# Patient Record
Sex: Male | Born: 1955 | Race: White | Hispanic: No | Marital: Married | Smoking: Current every day smoker
Health system: Southern US, Community
[De-identification: ages and names within clinical notes are randomized; demographics above are authoritative.]

## PROBLEM LIST (undated history)

## (undated) HISTORY — PX: HERNIA REPAIR: SHX51

---

## 2015-07-24 ENCOUNTER — Emergency Department (HOSPITAL_BASED_OUTPATIENT_CLINIC_OR_DEPARTMENT_OTHER)
Admission: EM | Admit: 2015-07-24 | Discharge: 2015-07-24 | Disposition: A | Discharge status: Home / Self Care | Payer: BLUE CROSS/BLUE SHIELD | Attending: Emergency Medicine | Admitting: Emergency Medicine

## 2015-07-24 ENCOUNTER — Encounter (HOSPITAL_BASED_OUTPATIENT_CLINIC_OR_DEPARTMENT_OTHER): Payer: Self-pay

## 2015-07-24 DIAGNOSIS — F17200 Nicotine dependence, unspecified, uncomplicated: Secondary | ICD-10-CM | POA: Diagnosis not present

## 2015-07-24 DIAGNOSIS — H8110 Benign paroxysmal vertigo, unspecified ear: Secondary | ICD-10-CM | POA: Insufficient documentation

## 2015-07-24 DIAGNOSIS — Z880 Allergy status to penicillin: Secondary | ICD-10-CM | POA: Diagnosis not present

## 2015-07-24 DIAGNOSIS — R42 Dizziness and giddiness: Secondary | ICD-10-CM | POA: Diagnosis present

## 2015-07-24 LAB — CBC WITH DIFFERENTIAL/PLATELET
BASOS ABS: 0 10*3/uL (ref 0.0–0.1)
BASOS PCT: 0 %
EOS PCT: 0 %
Eosinophils Absolute: 0 10*3/uL (ref 0.0–0.7)
HCT: 49.4 % (ref 39.0–52.0)
Hemoglobin: 16.4 g/dL (ref 13.0–17.0)
Lymphocytes Relative: 12 %
Lymphs Abs: 1.3 10*3/uL (ref 0.7–4.0)
MCH: 29.1 pg (ref 26.0–34.0)
MCHC: 33.2 g/dL (ref 30.0–36.0)
MCV: 87.6 fL (ref 78.0–100.0)
MONO ABS: 0.8 10*3/uL (ref 0.1–1.0)
Monocytes Relative: 7 %
Neutro Abs: 8.7 10*3/uL — ABNORMAL HIGH (ref 1.7–7.7)
Neutrophils Relative %: 81 %
PLATELETS: 210 10*3/uL (ref 150–400)
RBC: 5.64 MIL/uL (ref 4.22–5.81)
RDW: 13.3 % (ref 11.5–15.5)
WBC: 10.9 10*3/uL — ABNORMAL HIGH (ref 4.0–10.5)

## 2015-07-24 LAB — BASIC METABOLIC PANEL
ANION GAP: 7 (ref 5–15)
BUN: 13 mg/dL (ref 6–20)
CALCIUM: 9.3 mg/dL (ref 8.9–10.3)
CO2: 26 mmol/L (ref 22–32)
Chloride: 105 mmol/L (ref 101–111)
Creatinine, Ser: 0.96 mg/dL (ref 0.61–1.24)
Glucose, Bld: 127 mg/dL — ABNORMAL HIGH (ref 65–99)
Potassium: 4.3 mmol/L (ref 3.5–5.1)
Sodium: 138 mmol/L (ref 135–145)

## 2015-07-24 MED ORDER — MECLIZINE HCL 25 MG PO TABS
25.0000 mg | ORAL_TABLET | Freq: Once | ORAL | Status: AC
  Administered 2015-07-24: 25 mg via ORAL
  Filled 2015-07-24: qty 1

## 2015-07-24 MED ORDER — MECLIZINE HCL 25 MG PO TABS: 25.0000 mg | ORAL_TABLET | Freq: Three times a day (TID) | ORAL | Status: AC | PRN

## 2015-07-24 MED ORDER — DIAZEPAM 2 MG PO TABS
2.0000 mg | ORAL_TABLET | Freq: Once | ORAL | Status: DC
  Filled 2015-07-24: qty 1

## 2015-07-24 MED ORDER — DIAZEPAM 5 MG/ML IJ SOLN
2.5000 mg | Freq: Once | INTRAMUSCULAR | Status: AC
  Administered 2015-07-24: 2.5 mg via INTRAVENOUS
  Filled 2015-07-24: qty 2

## 2015-07-24 MED FILL — MECLIZINE 25 MG TABLET: 25 | 10 days supply | Qty: 30 | Fill #0

## 2015-07-24 NOTE — ED Notes (Signed)
Patient stable and ambulatory.  Patient verbalizes understanding of discharge medications, instructions and follow-up. 

## 2015-07-24 NOTE — ED Notes (Signed)
Tinnitus in right ear.

## 2015-07-24 NOTE — ED Provider Notes (Signed)
CSN: [id number]     Arrival date & time 07/24/15  1152 History   First MD Initiated Contact with Patient 07/24/15 1208     Chief Complaint  Patient presents with  . Dizziness     (Consider location/radiation/quality/duration/timing/severity/associated sxs/prior Treatment) Patient is a 60 y.o. male presenting with dizziness.  Dizziness Quality:  Lightheadedness Severity:  Moderate Onset quality:  Gradual Duration:  1 day Timing:  Constant Progression:  Worsening Chronicity:  New Context: head movement   Context: not with loss of consciousness   Relieved by:  Nothing Worsened by:  Nothing Ineffective treatments:  None tried Associated symptoms: nausea   Associated symptoms: no vomiting     History reviewed. No pertinent past medical history. Past Surgical History  Procedure Laterality Date  . Hernia repair     No family history on file. Social History  Substance Use Topics  . Smoking status: Current Every Day Smoker  . Smokeless tobacco: None  . Alcohol Use: Yes     Comment: rarely    Review of Systems  Gastrointestinal: Positive for nausea. Negative for vomiting.  Neurological: Positive for dizziness.  All other systems reviewed and are negative.     Allergies  Penicillins  Home Medications   Prior to Admission medications   Not on File   BP 142/77 mmHg  Pulse 58  Temp(Src) 97.5 F (36.4 C) (Oral)  Ht 6\' 3"  (1.905 m)  Wt 200 lb (90.719 kg)  BMI 25.00 kg/m2  SpO2 100% Physical Exam  Constitutional: He is oriented to person, place, and time. He appears well-developed and well-nourished. No distress.  HENT:  Head: Normocephalic and atraumatic.  Eyes: Conjunctivae are normal.  Neck: Neck supple. No tracheal deviation present.  Cardiovascular: Normal rate and regular rhythm.   Pulmonary/Chest: Effort normal. No respiratory distress.  Abdominal: Soft. He exhibits no distension.  Neurological: He is alert and oriented to person, place, and time. He  has normal strength. No cranial nerve deficit or sensory deficit. Coordination and gait normal. GCS eye subscore is 4. GCS verbal subscore is 5. GCS motor subscore is 6.  Normal finger to nose testing and rapid alternating movement. Normal head impulse testing, normal heel-to-shin testing.  Skin: Skin is warm and dry.  Psychiatric: He has a normal mood and affect.    ED Course  Procedures (including critical care time) Labs Review Labs Reviewed - No data to display  Imaging Review No results found. I have personally reviewed and evaluated these images and lab results as part of my medical decision-making.   EKG Interpretation None      MDM   Final diagnoses:  BPPV (benign paroxysmal positional vertigo), unspecified laterality    60 y.o. male presents with vertigo symptoms that started today at work. HINTS exam negative, symptoms are highly positional especially with tilting head back. No indication for imaging currently. Valium and meclizine improved symptoms greatly and Pt is ambulatory on discharge. Patient needs to establish primary care in the area and was provided contact information to do so.     Lyndal Pulleyaniel Maycie Luera, MD 07/24/15 [phone]

## 2015-07-24 NOTE — ED Notes (Addendum)
Dizziness, nausea, weakness since 0930am-steady gait-A/O-denies HA or CP

## 2015-07-24 NOTE — ED Notes (Signed)
Pt. Placed on auto vitals Q30.

## 2015-07-24 NOTE — Discharge Instructions (Signed)
Benign Positional Vertigo °Vertigo is the feeling that you or your surroundings are moving when they are not. Benign positional vertigo is the most common form of vertigo. The cause of this condition is not serious (is benign). This condition is triggered by certain movements and positions (is positional). This condition can be dangerous if it occurs while you are doing something that could endanger you or others, such as driving.  °CAUSES °In many cases, the cause of this condition is not known. It may be caused by a disturbance in an area of the inner ear that helps your brain to sense movement and balance. This disturbance can be caused by a viral infection (labyrinthitis), head injury, or repetitive motion. °RISK FACTORS °This condition is more likely to develop in: °· Women. °· People who are 50 years of age or older. °SYMPTOMS °Symptoms of this condition usually happen when you move your head or your eyes in different directions. Symptoms may start suddenly, and they usually last for less than a minute. Symptoms may include: °· Loss of balance and falling. °· Feeling like you are spinning or moving. °· Feeling like your surroundings are spinning or moving. °· Nausea and vomiting. °· Blurred vision. °· Dizziness. °· Involuntary eye movement (nystagmus). °Symptoms can be mild and cause only slight annoyance, or they can be severe and interfere with daily life. Episodes of benign positional vertigo may return (recur) over time, and they may be triggered by certain movements. Symptoms may improve over time. °DIAGNOSIS °This condition is usually diagnosed by medical history and a physical exam of the head, neck, and ears. You may be referred to a health care provider who specializes in ear, nose, and throat (ENT) problems (otolaryngologist) or a provider who specializes in disorders of the nervous system (neurologist). You may have additional testing, including: °· MRI. °· A CT scan. °· Eye movement tests. Your  health care provider may ask you to change positions quickly while he or she watches you for symptoms of benign positional vertigo, such as nystagmus. Eye movement may be tested with an electronystagmogram (ENG), caloric stimulation, the Dix-Hallpike test, or the roll test. °· An electroencephalogram (EEG). This records electrical activity in your brain. °· Hearing tests. °TREATMENT °Usually, your health care provider will treat this by moving your head in specific positions to adjust your inner ear back to normal. Surgery may be needed in severe cases, but this is rare. In some cases, benign positional vertigo may resolve on its own in 2-4 weeks. °HOME CARE INSTRUCTIONS °Safety °· Move slowly. Avoid sudden body or head movements. °· Avoid driving. °· Avoid operating heavy machinery. °· Avoid doing any tasks that would be dangerous to you or others if a vertigo episode would occur. °· If you have trouble walking or keeping your balance, try using a cane for stability. If you feel dizzy or unstable, sit down right away. °· Return to your normal activities as told by your health care provider. Ask your health care provider what activities are safe for you. °General Instructions °· Take over-the-counter and prescription medicines only as told by your health care provider. °· Avoid certain positions or movements as told by your health care provider. °· Drink enough fluid to keep your urine clear or pale yellow. °· Keep all follow-up visits as told by your health care provider. This is important. °SEEK MEDICAL CARE IF: °· You have a fever. °· Your condition gets worse or you develop new symptoms. °· Your family or friends   notice any behavioral changes. °· Your nausea or vomiting gets worse. °· You have numbness or a "pins and needles" sensation. °SEEK IMMEDIATE MEDICAL CARE IF: °· You have difficulty speaking or moving. °· You are always dizzy. °· You faint. °· You develop severe headaches. °· You have weakness in your  legs or arms. °· You have changes in your hearing or vision. °· You develop a stiff neck. °· You develop sensitivity to light. °  °This information is not intended to replace advice given to you by your health care provider. Make sure you discuss any questions you have with your health care provider. °  °Document Released: 04/12/2006 Document Revised: 03/26/2015 Document Reviewed: 10/28/2014 °Elsevier Interactive Patient Education ©2016 [employer] ° °Dizziness °Dizziness is a common problem. It is a feeling of unsteadiness or light-headedness. You may feel like you are about to faint. Dizziness can lead to injury if you stumble or fall. Anyone can become dizzy, but dizziness is more common in older adults. This condition can be caused by a number of things, including medicines, dehydration, or illness. °HOME CARE INSTRUCTIONS °Taking these steps may help with your condition: °Eating and Drinking °· Drink enough fluid to keep your urine clear or pale yellow. This helps to keep you from becoming dehydrated. Try to drink more clear fluids, such as water. °· Do not drink alcohol. °· Limit your caffeine intake if directed by your health care provider. °· Limit your salt intake if directed by your health care provider. °Activity °· Avoid making quick movements. °¨ Rise slowly from chairs and steady yourself until you feel okay. °¨ In the morning, first sit up on the side of the bed. When you feel okay, stand slowly while you hold onto something until you know that your balance is fine. °· Move your legs often if you need to stand in one place for a long time. Tighten and relax your muscles in your legs while you are standing. °· Do not drive or operate heavy machinery if you feel dizzy. °· Avoid bending down if you feel dizzy. Place items in your home so that they are easy for you to reach without leaning over. °Lifestyle °· Do not use any tobacco products, including cigarettes, chewing tobacco, or electronic  cigarettes. If you need help quitting, ask your health care provider. °· Try to reduce your stress level, such as with yoga or meditation. Talk with your health care provider if you need help. °General Instructions °· Watch your dizziness for any changes. °· Take medicines only as directed by your health care provider. Talk with your health care provider if you think that your dizziness is caused by a medicine that you are taking. °· Tell a friend or a family member that you are feeling dizzy. If he or she notices any changes in your behavior, have this person call your health care provider. °· Keep all follow-up visits as directed by your health care provider. This is important. °SEEK MEDICAL CARE IF: °· Your dizziness does not go away. °· Your dizziness or light-headedness gets worse. °· You feel nauseous. °· You have reduced hearing. °· You have new symptoms. °· You are unsteady on your feet or you feel like the room is spinning. °SEEK IMMEDIATE MEDICAL CARE IF: °· You vomit or have diarrhea and are unable to eat or drink anything. °· You have problems talking, walking, swallowing, or using your arms, hands, or legs. °· You feel generally weak. °· You are not   thinking clearly or you have trouble forming sentences. It may take a friend or family member to notice this. °· You have chest pain, abdominal pain, shortness of breath, or sweating. °· Your vision changes. °· You notice any bleeding. °· You have a headache. °· You have neck pain or a stiff neck. °· You have a fever. °  °This information is not intended to replace advice given to you by your health care provider. Make sure you discuss any questions you have with your health care provider. °  °Document Released: 12/29/2000 Document Revised: 11/19/2014 Document Reviewed: 07/01/2014 °Elsevier Interactive Patient Education ©2016 [employer] ° °

## 2017-10-12 ENCOUNTER — Other Ambulatory Visit: Payer: Self-pay | Admitting: *Deleted

## 2017-10-12 ENCOUNTER — Ambulatory Visit
Admission: RE | Admit: 2017-10-12 | Discharge: 2017-10-12 | Disposition: A | Discharge status: Home / Self Care | Payer: BLUE CROSS/BLUE SHIELD | Source: Ambulatory Visit | Attending: *Deleted | Admitting: *Deleted

## 2017-10-12 DIAGNOSIS — R591 Generalized enlarged lymph nodes: Secondary | ICD-10-CM

## 2017-10-14 ENCOUNTER — Other Ambulatory Visit: Payer: Self-pay | Admitting: Family Medicine

## 2017-10-14 DIAGNOSIS — R9389 Abnormal findings on diagnostic imaging of other specified body structures: Secondary | ICD-10-CM

## 2017-10-14 DIAGNOSIS — R221 Localized swelling, mass and lump, neck: Secondary | ICD-10-CM

## 2017-10-14 DIAGNOSIS — R591 Generalized enlarged lymph nodes: Secondary | ICD-10-CM

## 2017-10-19 ENCOUNTER — Ambulatory Visit
Admission: RE | Admit: 2017-10-19 | Discharge: 2017-10-19 | Disposition: A | Discharge status: Home / Self Care | Payer: BLUE CROSS/BLUE SHIELD | Source: Ambulatory Visit | Attending: Family Medicine | Admitting: Family Medicine

## 2017-10-19 DIAGNOSIS — R591 Generalized enlarged lymph nodes: Secondary | ICD-10-CM

## 2017-10-19 DIAGNOSIS — R9389 Abnormal findings on diagnostic imaging of other specified body structures: Secondary | ICD-10-CM

## 2017-10-19 DIAGNOSIS — R221 Localized swelling, mass and lump, neck: Secondary | ICD-10-CM

## 2017-10-19 MED ORDER — IOPAMIDOL (ISOVUE-300) INJECTION 61%
75.0000 mL | Freq: Once | INTRAVENOUS | Status: AC | PRN
  Administered 2017-10-19: 75 mL via INTRAVENOUS

## 2017-10-24 ENCOUNTER — Other Ambulatory Visit: Payer: BLUE CROSS/BLUE SHIELD

## 2019-08-20 ENCOUNTER — Ambulatory Visit: Payer: BLUE CROSS/BLUE SHIELD

## 2019-08-26 ENCOUNTER — Ambulatory Visit: Payer: Medicare Other | Attending: Internal Medicine

## 2019-08-26 DIAGNOSIS — Z23 Encounter for immunization: Secondary | ICD-10-CM

## 2019-08-26 NOTE — Progress Notes (Signed)
   Covid-19 Vaccination Clinic  Name:  Andres Scott    MRN: [id number] DOB: [birth year]  08/26/2019  [name] was observed post Covid-19 immunization for 15 minutes without incidence. He was provided with Vaccine Information Sheet and instruction to access the V-Safe system.   [name] was instructed to call 911 with any severe reactions post vaccine: Marland Kitchen Difficulty breathing  . Swelling of your face and throat  . A fast heartbeat  . A bad rash all over your body  . Dizziness and weakness    Immunizations Administered    Name Date Dose VIS Date Route   Pfizer COVID-19 Vaccine 08/26/2019 11:28 AM 0.3 mL 06/29/2019 Intramuscular   Manufacturer: [employer], [employer]   Lot: [id number]   NDC: [address]-4

## 2019-09-19 ENCOUNTER — Ambulatory Visit: Payer: Medicare Other | Attending: Internal Medicine

## 2019-09-19 DIAGNOSIS — Z23 Encounter for immunization: Secondary | ICD-10-CM | POA: Insufficient documentation

## 2019-09-19 NOTE — Progress Notes (Signed)
   Covid-19 Vaccination Clinic  Name:  Andres Scott    MRN: [id number] DOB: [birth year]  09/19/2019  [name] was observed post Covid-19 immunization for 15 minutes without incident. He was provided with Vaccine Information Sheet and instruction to access the V-Safe system.   [name] was instructed to call 911 with any severe reactions post vaccine: Marland Kitchen Difficulty breathing  . Swelling of face and throat  . A fast heartbeat  . A bad rash all over body  . Dizziness and weakness   Immunizations Administered    Name Date Dose VIS Date Route   Pfizer COVID-19 Vaccine 09/19/2019  8:41 AM 0.3 mL 06/29/2019 Intramuscular   Manufacturer: [employer], [employer]   Lot: [id number]   NDC: [address]-4

## 2019-11-02 IMAGING — CT CT NECK W/ CM
4 of 6 series · 12 of 33 positions shown, 14 images · IV contrast (iopamidol)
Comparison: Neck ultrasound 10/12/2017

CLINICAL DATA: Mass left lower neck.

Creatinine was obtained on site at [HOSPITAL] at [REDACTED].
Results: Creatinine 1.1 mg/dL.
EXAM:
CT NECK WITH CONTRAST
TECHNIQUE: Multidetector CT imaging of the neck was performed using the
standard protocol following the bolus administration of intravenous
contrast.
CONTRAST:  75mL 55PZJH-NAA IOPAMIDOL (55PZJH-NAA) INJECTION 61%

[Series 5: neck 2.00 br60 s3 bone · axial · 0.36mm/px · z∈[-753,-649]mm · 2 of 158 slices shown, 3 images]
[im 53/158  soft-tissue]
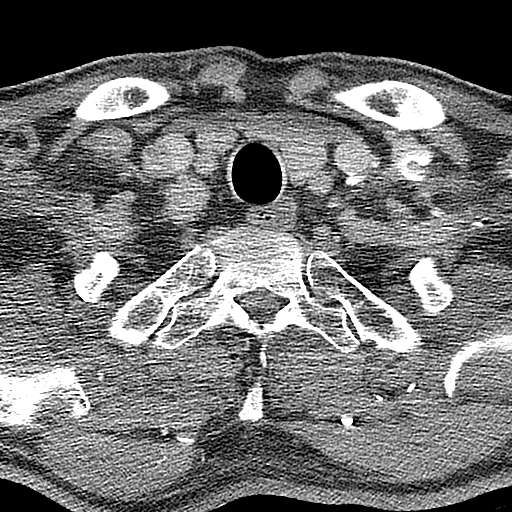
[im 53/158  bone]
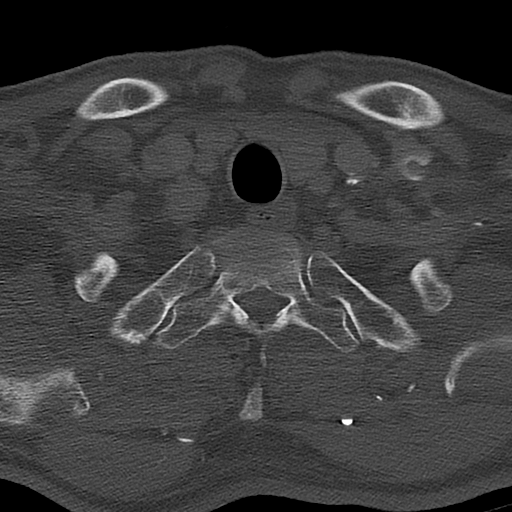
[im 105/158  bone]
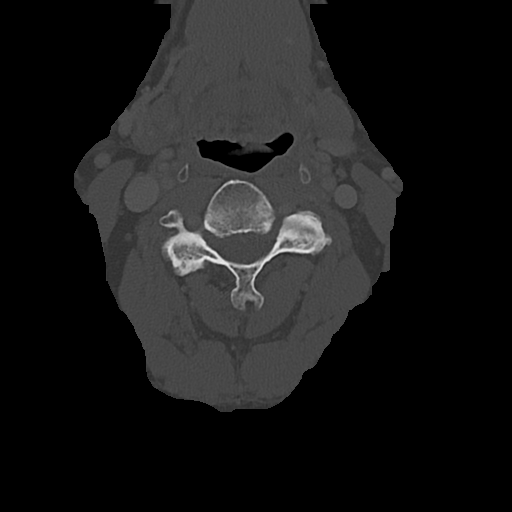

[Series 6: neck 2.00 br40 s3 ax oropharynx (person_name) · axial · 0.40mm/px · z∈[-782,-675]mm · 2 of 162 slices shown]
[im 54/162  bone]
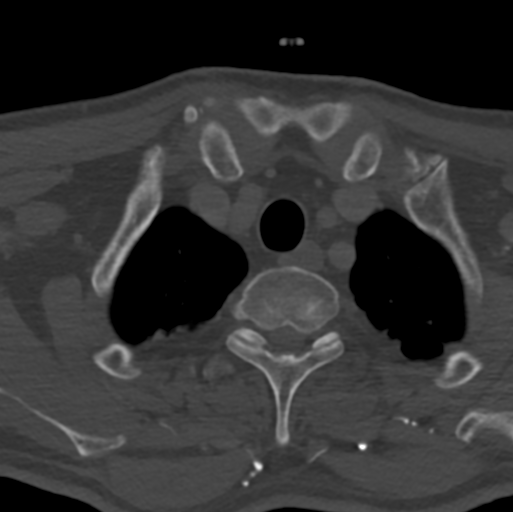
[im 108/162  bone]
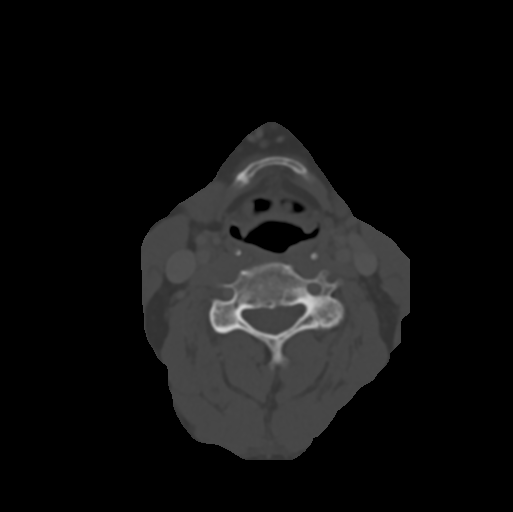

[Series 8: neck 2.00 br36 s3 cor coronal (person_name) · coronal · 0.63mm/px · 3 of 113 slices shown]
[im 23/113  bone]
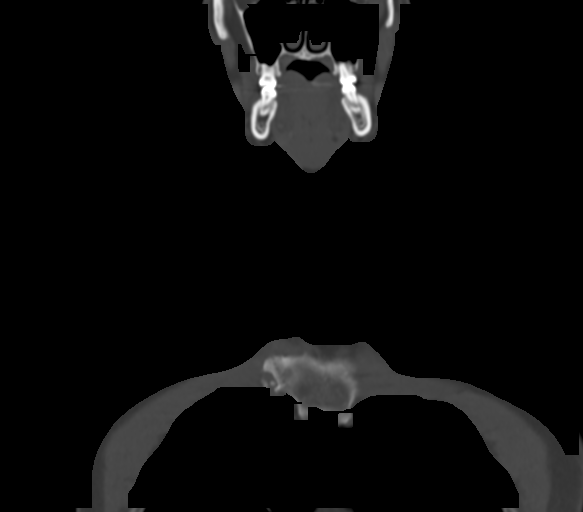
[im 45/113  bone]
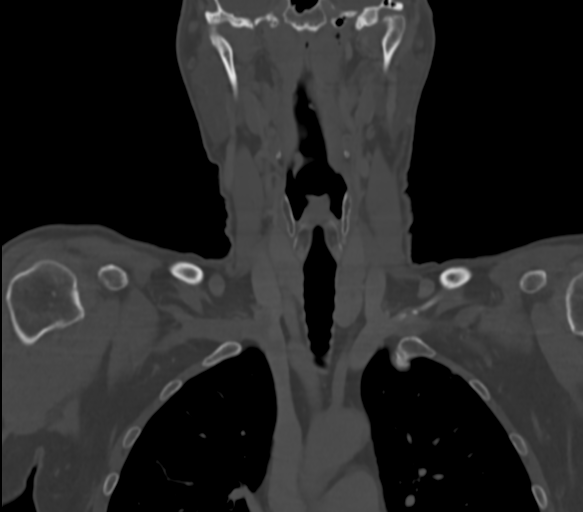
[im 68/113  bone]
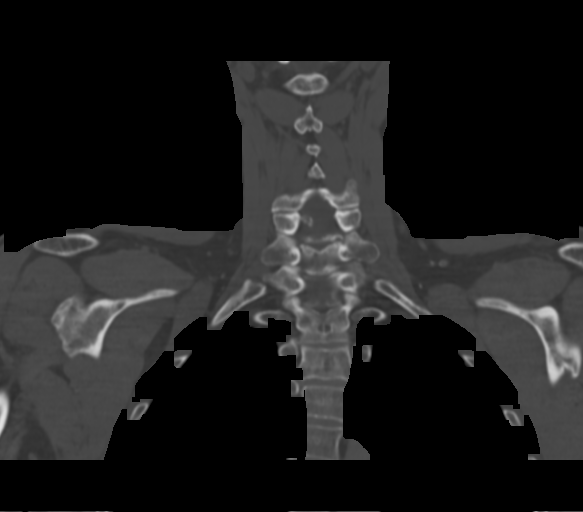

[Series 10: neck 2.00 br36 s3 sag sagittal (person_name) · sagittal · 0.40mm/px · 5 of 158 slices shown, 6 images]
[im 53/158  bone]
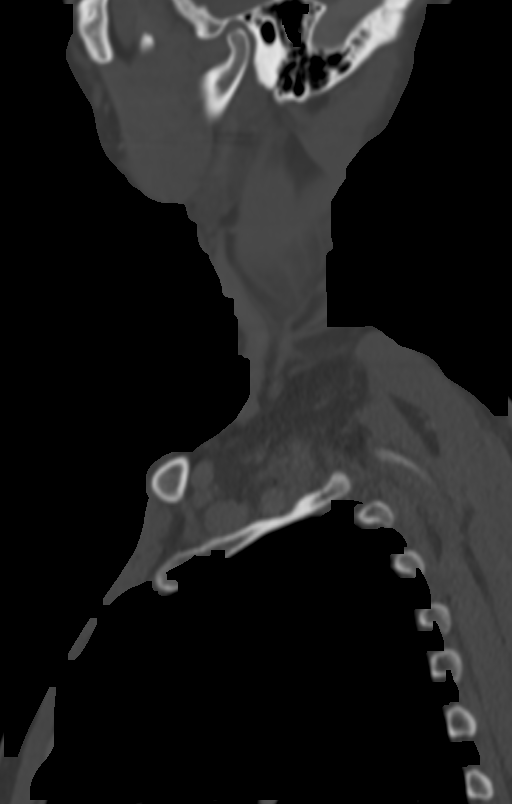
[im 66/158  bone]
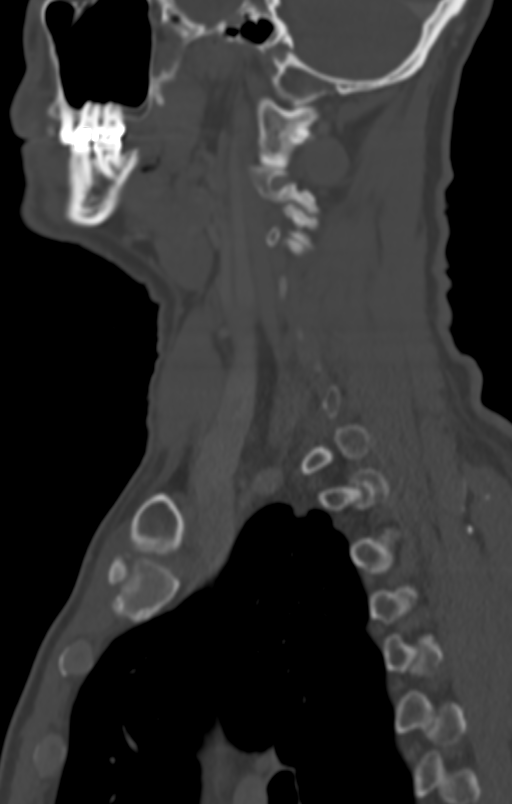
[im 79/158  soft-tissue]
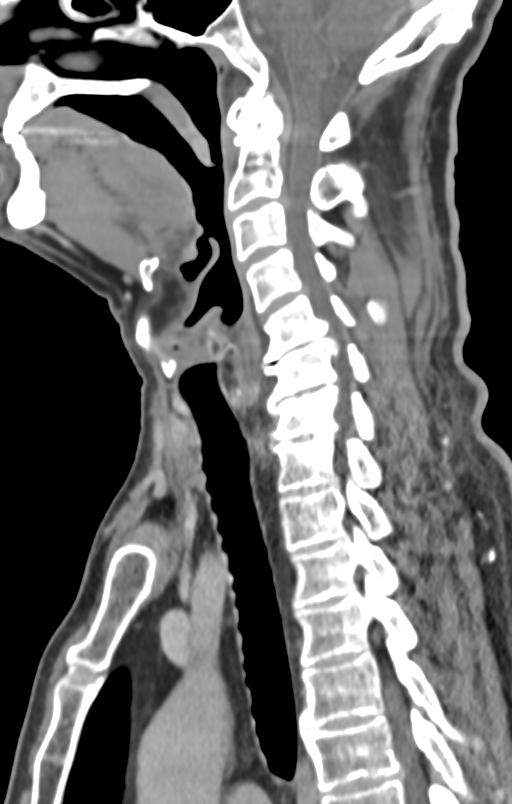
[im 79/158  bone]
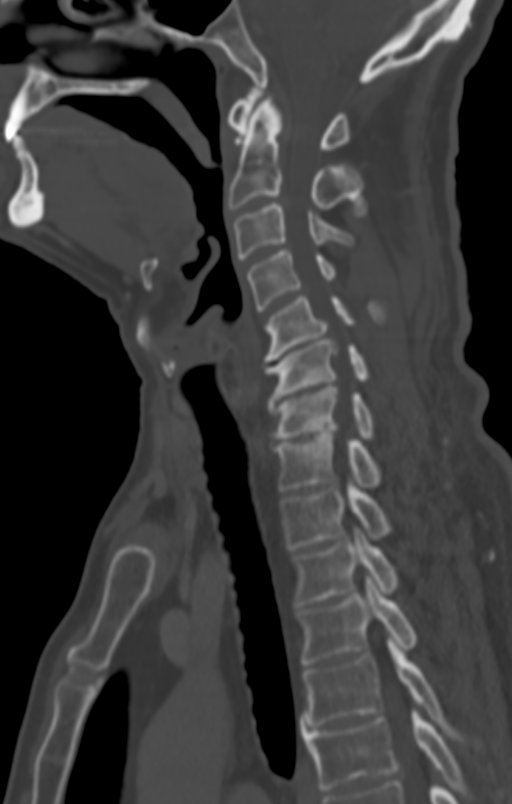
[im 92/158  bone]
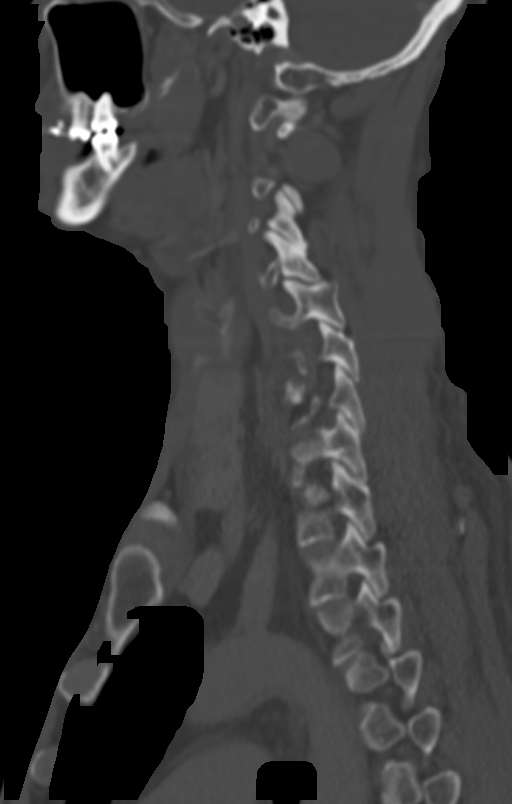
[im 105/158  bone]
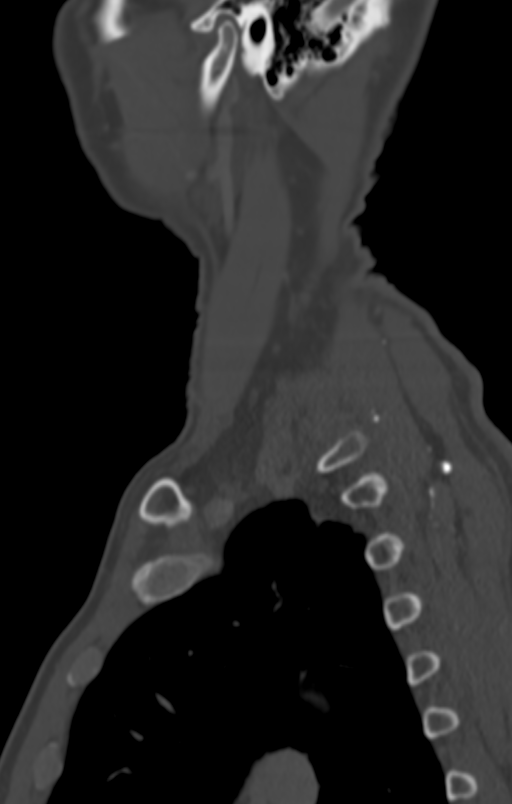

[12 of 33 positions shown; findings below may reference images not displayed]

FINDINGS: Pharynx and larynx: Normal. No mass or swelling.

Salivary glands: No inflammation, mass, or stone.

Thyroid: 5 mm nodule on the left.  No mass lesion.

Lymph nodes: No enlarged lymph nodes in the neck.

Vascular: Normal arterial and venous enhancement

Limited intracranial: Negative

Visualized orbits: Negative

Mastoids and visualized paranasal sinuses: Negative

Skeleton: Cervical spondylosis and kyphosis most prominent C5-6,
C6-7, C7-T1. No acute skeletal lesion.

Upper chest: 5 mm nodule in the left upper lobe. On the sagittal
images, no other lung nodule. Apical emphysema. Apical scarring
bilaterally.

Other: Palpable abnormality in the left lower neck laterally
measures 18 mm. There is central fluid collection with surrounding
enhancing soft tissue. The skin is thickened in the area. The lesion
is in the subcutaneous tissues extending to the skin. Central fluid
collection is suggestive of infection.
IMPRESSION: Palpable abnormality in the left lower neck laterally is centered in
the subcutaneous tissues. The lesion measures 18 mm with central
fluid collection and thick enhancing rim. This may represent an area
of chronic infection. Consider biopsy if atypical clinical features.
No adenopathy.

5 mm left upper lobe nodule. No follow-up needed if patient is
low-risk. Non-contrast chest CT can be considered in 12 months if
patient is high-risk. This recommendation follows the consensus
statement: Guidelines for Management of Incidental Pulmonary Nodules
Detected on CT Images: From the [HOSPITAL] 4404; Radiology

## 2021-02-19 DIAGNOSIS — Z85828 Personal history of other malignant neoplasm of skin: Secondary | ICD-10-CM | POA: Diagnosis not present

## 2021-02-19 DIAGNOSIS — L570 Actinic keratosis: Secondary | ICD-10-CM | POA: Diagnosis not present

## 2021-02-19 DIAGNOSIS — D0462 Carcinoma in situ of skin of left upper limb, including shoulder: Secondary | ICD-10-CM | POA: Diagnosis not present

## 2021-02-19 DIAGNOSIS — L814 Other melanin hyperpigmentation: Secondary | ICD-10-CM | POA: Diagnosis not present

## 2021-03-02 DIAGNOSIS — Z03818 Encounter for observation for suspected exposure to other biological agents ruled out: Secondary | ICD-10-CM | POA: Diagnosis not present

## 2021-03-02 DIAGNOSIS — B349 Viral infection, unspecified: Secondary | ICD-10-CM | POA: Diagnosis not present

## 2021-03-02 DIAGNOSIS — J0100 Acute maxillary sinusitis, unspecified: Secondary | ICD-10-CM | POA: Diagnosis not present

## 2021-03-02 DIAGNOSIS — R059 Cough, unspecified: Secondary | ICD-10-CM | POA: Diagnosis not present

## 2021-04-29 DIAGNOSIS — J069 Acute upper respiratory infection, unspecified: Secondary | ICD-10-CM | POA: Diagnosis not present

## 2021-04-29 DIAGNOSIS — R051 Acute cough: Secondary | ICD-10-CM | POA: Diagnosis not present

## 2021-04-29 DIAGNOSIS — Z20828 Contact with and (suspected) exposure to other viral communicable diseases: Secondary | ICD-10-CM | POA: Diagnosis not present

## 2021-04-29 DIAGNOSIS — R0981 Nasal congestion: Secondary | ICD-10-CM | POA: Diagnosis not present

## 2022-02-22 DIAGNOSIS — L570 Actinic keratosis: Secondary | ICD-10-CM | POA: Diagnosis not present

## 2022-02-22 DIAGNOSIS — L821 Other seborrheic keratosis: Secondary | ICD-10-CM | POA: Diagnosis not present

## 2022-02-22 DIAGNOSIS — Z85828 Personal history of other malignant neoplasm of skin: Secondary | ICD-10-CM | POA: Diagnosis not present

## 2022-02-22 DIAGNOSIS — C4442 Squamous cell carcinoma of skin of scalp and neck: Secondary | ICD-10-CM | POA: Diagnosis not present

## 2022-02-22 DIAGNOSIS — D044 Carcinoma in situ of skin of scalp and neck: Secondary | ICD-10-CM | POA: Diagnosis not present

## 2023-01-11 DIAGNOSIS — J0110 Acute frontal sinusitis, unspecified: Secondary | ICD-10-CM | POA: Diagnosis not present

## 2023-02-17 DIAGNOSIS — K080 Exfoliation of teeth due to systemic causes: Secondary | ICD-10-CM | POA: Diagnosis not present

## 2023-02-24 DIAGNOSIS — L570 Actinic keratosis: Secondary | ICD-10-CM | POA: Diagnosis not present

## 2023-02-24 DIAGNOSIS — C44519 Basal cell carcinoma of skin of other part of trunk: Secondary | ICD-10-CM | POA: Diagnosis not present

## 2023-02-24 DIAGNOSIS — Z85828 Personal history of other malignant neoplasm of skin: Secondary | ICD-10-CM | POA: Diagnosis not present

## 2023-02-24 DIAGNOSIS — D1801 Hemangioma of skin and subcutaneous tissue: Secondary | ICD-10-CM | POA: Diagnosis not present

## 2023-02-24 DIAGNOSIS — L821 Other seborrheic keratosis: Secondary | ICD-10-CM | POA: Diagnosis not present

## 2023-02-24 DIAGNOSIS — C4441 Basal cell carcinoma of skin of scalp and neck: Secondary | ICD-10-CM | POA: Diagnosis not present

## 2023-03-15 DIAGNOSIS — C4441 Basal cell carcinoma of skin of scalp and neck: Secondary | ICD-10-CM | POA: Diagnosis not present

## 2023-03-15 DIAGNOSIS — C44519 Basal cell carcinoma of skin of other part of trunk: Secondary | ICD-10-CM | POA: Diagnosis not present

## 2023-09-05 DIAGNOSIS — K080 Exfoliation of teeth due to systemic causes: Secondary | ICD-10-CM | POA: Diagnosis not present

## 2023-09-20 DIAGNOSIS — K080 Exfoliation of teeth due to systemic causes: Secondary | ICD-10-CM | POA: Diagnosis not present

## 2024-02-28 DIAGNOSIS — L821 Other seborrheic keratosis: Secondary | ICD-10-CM | POA: Diagnosis not present

## 2024-02-28 DIAGNOSIS — L570 Actinic keratosis: Secondary | ICD-10-CM | POA: Diagnosis not present

## 2024-02-28 DIAGNOSIS — D1801 Hemangioma of skin and subcutaneous tissue: Secondary | ICD-10-CM | POA: Diagnosis not present

## 2024-02-28 DIAGNOSIS — Z85828 Personal history of other malignant neoplasm of skin: Secondary | ICD-10-CM | POA: Diagnosis not present

## 2024-04-24 DIAGNOSIS — K080 Exfoliation of teeth due to systemic causes: Secondary | ICD-10-CM | POA: Diagnosis not present

## 2024-05-10 DIAGNOSIS — K080 Exfoliation of teeth due to systemic causes: Secondary | ICD-10-CM | POA: Diagnosis not present
